# Patient Record
Sex: Male | Born: 2000 | Race: White | Hispanic: No | Marital: Single | Smoking: Never smoker
Health system: Southern US, Community
[De-identification: ages and names within clinical notes are randomized; demographics above are authoritative.]

---
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
## 2020-07-29 ENCOUNTER — Emergency Department (HOSPITAL_COMMUNITY): Payer: PRIVATE HEALTH INSURANCE

## 2020-07-29 ENCOUNTER — Encounter (HOSPITAL_COMMUNITY): Payer: Self-pay

## 2020-07-29 ENCOUNTER — Emergency Department (HOSPITAL_COMMUNITY)
Admission: EM | Admit: 2020-07-29 | Discharge: 2020-07-29 | Disposition: A | Discharge status: Home / Self Care | Payer: PRIVATE HEALTH INSURANCE | Attending: Emergency Medicine | Admitting: Emergency Medicine

## 2020-07-29 DIAGNOSIS — Z23 Encounter for immunization: Secondary | ICD-10-CM | POA: Insufficient documentation

## 2020-07-29 DIAGNOSIS — S069X0A Unspecified intracranial injury without loss of consciousness, initial encounter: Secondary | ICD-10-CM | POA: Diagnosis not present

## 2020-07-29 DIAGNOSIS — S4992XA Unspecified injury of left shoulder and upper arm, initial encounter: Secondary | ICD-10-CM | POA: Diagnosis present

## 2020-07-29 DIAGNOSIS — S80212A Abrasion, left knee, initial encounter: Secondary | ICD-10-CM | POA: Diagnosis not present

## 2020-07-29 DIAGNOSIS — S51812A Laceration without foreign body of left forearm, initial encounter: Secondary | ICD-10-CM | POA: Insufficient documentation

## 2020-07-29 DIAGNOSIS — Y92410 Unspecified street and highway as the place of occurrence of the external cause: Secondary | ICD-10-CM | POA: Insufficient documentation

## 2020-07-29 DIAGNOSIS — S42025A Nondisplaced fracture of shaft of left clavicle, initial encounter for closed fracture: Secondary | ICD-10-CM | POA: Diagnosis not present

## 2020-07-29 DIAGNOSIS — S60512A Abrasion of left hand, initial encounter: Secondary | ICD-10-CM | POA: Diagnosis not present

## 2020-07-29 LAB — CBC
HCT: 40.5 % (ref 39.0–52.0)
Hemoglobin: 13 g/dL (ref 13.0–17.0)
MCH: 30.3 pg (ref 26.0–34.0)
MCHC: 32.1 g/dL (ref 30.0–36.0)
MCV: 94.4 fL (ref 80.0–100.0)
Platelets: 163 10*3/uL (ref 150–400)
RBC: 4.29 MIL/uL (ref 4.22–5.81)
RDW: 11.9 % (ref 11.5–15.5)
WBC: 4 10*3/uL (ref 4.0–10.5)
nRBC: 0 % (ref 0.0–0.2)

## 2020-07-29 LAB — BASIC METABOLIC PANEL
Anion gap: 9 (ref 5–15)
BUN: 14 mg/dL (ref 6–20)
CO2: 22 mmol/L (ref 22–32)
Calcium: 8.9 mg/dL (ref 8.9–10.3)
Chloride: 107 mmol/L (ref 98–111)
Creatinine, Ser: 1.15 mg/dL (ref 0.61–1.24)
GFR calc Af Amer: >60 mL/min (ref >60)
GFR calc non Af Amer: >60 mL/min (ref >60)
Glucose, Bld: 99 mg/dL (ref 70–99)
Potassium: 4.8 mmol/L (ref 3.5–5.1)
Sodium: 138 mmol/L (ref 135–145)

## 2020-07-29 MED ORDER — TETANUS-DIPHTH-ACELL PERTUSSIS 5-2.5-18.5 LF-MCG/0.5 IM SUSP
0.5000 mL | Freq: Once | INTRAMUSCULAR | Status: AC
  Administered 2020-07-29: 0.5 mL via INTRAMUSCULAR
  Filled 2020-07-29: qty 0.5

## 2020-07-29 MED ORDER — FENTANYL CITRATE (PF) 100 MCG/2ML IJ SOLN
50.0000 ug | Freq: Once | INTRAMUSCULAR | Status: AC
  Administered 2020-07-29: 50 ug via INTRAVENOUS
  Filled 2020-07-29: qty 2

## 2020-07-29 MED ORDER — OXYCODONE-ACETAMINOPHEN 5-325 MG PO TABS
2.0000 | ORAL_TABLET | Freq: Once | ORAL | Status: AC
  Administered 2020-07-29: 2 via ORAL
  Filled 2020-07-29: qty 2

## 2020-07-29 MED ORDER — EMPTY CONTAINERS FLEXIBLE MISC
50.0000 ug/h | Status: DC
Start: 2020-07-29 — End: 2020-07-29

## 2020-07-29 MED ORDER — OXYCODONE-ACETAMINOPHEN 5-325 MG PO TABS: 2.0000 | ORAL_TABLET | ORAL | 0 refills | Status: AC | PRN

## 2020-07-29 NOTE — Progress Notes (Signed)
   07/29/20 1304  Clinical Encounter Type  Visited With Patient  Visit Type Trauma  Referral From Nurse  Consult/Referral To Chaplain   Chaplain responded to Level 2 trauma. Pt currently in medical evaluations/procedures. Doctor updated Pt's mother, not yet present. Chaplain remains available as needed.  This note was prepared by Chaplain Resident, Tacy Learn, MDiv. Chaplain remains available as needed through the on-call pager: [phone].
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
## 2020-07-29 NOTE — ED Notes (Signed)
Patient verbalizes understanding of discharge instructions. Opportunity for questioning and answers were provided. Armband removed by staff, pt discharged from ED ambulatory.   
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
## 2020-07-29 NOTE — TOC Initial Note (Signed)
Transition of Care [facility]) - Initial/Assessment Note    Patient Details  Name: Patrick Terry MRN: [id number] Date of Birth: 2001/09/11  [facility] of [facility]) CM/SW Contact:    Lockie Pares, RN Phone Number: 07/29/2020, 2:32 PM  Clinical Narrative:                 Consulted for patient expression of finacial burden. Spoike to patient, does not know what his insurance covers.worried about paying for this. Referred paitent to [facility] post for follow up, spoke about their  Working with him and finacial counseling. Will consult financial counseling   Regarding this visit to reach out to patient. He can see what is covered by calling insurance, also finding out about a deductible.  Expected Discharge Plan: Home/Self Care     Patient Goals and CMS Choice        Expected Discharge Plan and Services Expected Discharge Plan: Home/Self Care   Discharge Planning Services: CM Consult                                          Prior Living Arrangements/Services     Patient language and need for interpreter reviewed:: Yes        Need for Family Participation in Patient Care: Yes (Comment) Care giver support system in place?: Yes (comment)   Criminal Activity/Legal Involvement Pertinent to Current Situation/Hospitalization: No - Comment as needed  Activities of Daily Living      Permission Sought/Granted                  Emotional Assessment Appearance:: Appears stated age Attitude/Demeanor/Rapport: Gracious Affect (typically observed): Anxious, Tearful/Crying Orientation: : Oriented to Self, Oriented to Place, Oriented to  Time, Oriented to Situation Alcohol / Substance Use: Not Applicable Psych Involvement: No (comment)  Admission diagnosis:  level 2 There are no problems to display for this patient.  PCP:  Patient, No Pcp Per Pharmacy:   [facility] [address], [place] - [address] [place]  [address] Phone: [phone] Fax: [phone]     Social Determinants of Health (SDOH) Interventions    Readmission Risk Interventions No flowsheet data found.

## 2020-07-29 NOTE — Discharge Instructions (Addendum)
Please use immobilization and cold therapy to treat your clavicle fracture. Ibuprofen can be used for muscle pain. Return to the ED if you have worsening symptoms especially chest pain or shortness of breath.
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
## 2020-08-30 ENCOUNTER — Other Ambulatory Visit: Payer: Self-pay

## 2020-08-30 ENCOUNTER — Ambulatory Visit: Payer: PRIVATE HEALTH INSURANCE | Admitting: Physician Assistant

## 2020-12-01 ENCOUNTER — Ambulatory Visit
Admission: RE | Admit: 2020-12-01 | Discharge: 2020-12-01 | Disposition: A | Discharge status: Home / Self Care | Payer: 59 | Source: Ambulatory Visit | Attending: Family Medicine | Admitting: Family Medicine

## 2020-12-01 ENCOUNTER — Other Ambulatory Visit: Payer: Self-pay | Admitting: Family Medicine

## 2020-12-01 ENCOUNTER — Other Ambulatory Visit: Payer: Self-pay

## 2020-12-01 DIAGNOSIS — N23 Unspecified renal colic: Secondary | ICD-10-CM
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
